# Patient Record
Sex: Female | Born: 1965 | Race: White | Hispanic: No | Marital: Married | Smoking: Never smoker
Health system: Southern US, Community
[De-identification: ages and names within clinical notes are randomized; demographics above are authoritative.]

## PROBLEM LIST (undated history)

## (undated) DIAGNOSIS — K589 Irritable bowel syndrome without diarrhea: Secondary | ICD-10-CM

## (undated) DIAGNOSIS — Z889 Allergy status to unspecified drugs, medicaments and biological substances status: Secondary | ICD-10-CM

## (undated) HISTORY — PX: KNEE SURGERY: SHX244

## (undated) HISTORY — DX: Irritable bowel syndrome, unspecified: K58.9

## (undated) HISTORY — PX: OTHER SURGICAL HISTORY: SHX169

## (undated) HISTORY — DX: Allergy status to unspecified drugs, medicaments and biological substances: Z88.9

---

## 2006-08-14 ENCOUNTER — Ambulatory Visit: Payer: Self-pay | Admitting: Cardiology

## 2006-08-18 ENCOUNTER — Ambulatory Visit: Payer: Self-pay | Admitting: Cardiology

## 2006-09-11 ENCOUNTER — Ambulatory Visit: Payer: Self-pay | Admitting: Cardiology

## 2015-08-10 ENCOUNTER — Encounter (HOSPITAL_COMMUNITY): Payer: Self-pay

## 2015-08-10 ENCOUNTER — Emergency Department (HOSPITAL_COMMUNITY): Payer: 59

## 2015-08-10 ENCOUNTER — Emergency Department (HOSPITAL_COMMUNITY)
Admission: EM | Admit: 2015-08-10 | Discharge: 2015-08-10 | Disposition: A | Discharge status: Home / Self Care | Payer: 59 | Attending: Emergency Medicine | Admitting: Emergency Medicine

## 2015-08-10 DIAGNOSIS — Y9301 Activity, walking, marching and hiking: Secondary | ICD-10-CM | POA: Diagnosis not present

## 2015-08-10 DIAGNOSIS — S8002XA Contusion of left knee, initial encounter: Secondary | ICD-10-CM | POA: Insufficient documentation

## 2015-08-10 DIAGNOSIS — S80212A Abrasion, left knee, initial encounter: Secondary | ICD-10-CM | POA: Diagnosis not present

## 2015-08-10 DIAGNOSIS — S6991XA Unspecified injury of right wrist, hand and finger(s), initial encounter: Secondary | ICD-10-CM | POA: Diagnosis not present

## 2015-08-10 DIAGNOSIS — S01511A Laceration without foreign body of lip, initial encounter: Secondary | ICD-10-CM | POA: Insufficient documentation

## 2015-08-10 DIAGNOSIS — S0992XA Unspecified injury of nose, initial encounter: Secondary | ICD-10-CM | POA: Diagnosis not present

## 2015-08-10 DIAGNOSIS — S0993XA Unspecified injury of face, initial encounter: Secondary | ICD-10-CM | POA: Diagnosis present

## 2015-08-10 DIAGNOSIS — T148 Other injury of unspecified body region: Secondary | ICD-10-CM | POA: Diagnosis not present

## 2015-08-10 DIAGNOSIS — Y9289 Other specified places as the place of occurrence of the external cause: Secondary | ICD-10-CM | POA: Insufficient documentation

## 2015-08-10 DIAGNOSIS — W01198A Fall on same level from slipping, tripping and stumbling with subsequent striking against other object, initial encounter: Secondary | ICD-10-CM | POA: Insufficient documentation

## 2015-08-10 DIAGNOSIS — Z23 Encounter for immunization: Secondary | ICD-10-CM | POA: Insufficient documentation

## 2015-08-10 DIAGNOSIS — W19XXXA Unspecified fall, initial encounter: Secondary | ICD-10-CM

## 2015-08-10 DIAGNOSIS — T07XXXA Unspecified multiple injuries, initial encounter: Secondary | ICD-10-CM

## 2015-08-10 DIAGNOSIS — Y998 Other external cause status: Secondary | ICD-10-CM | POA: Diagnosis not present

## 2015-08-10 MED ORDER — TETANUS-DIPHTH-ACELL PERTUSSIS 5-2.5-18.5 LF-MCG/0.5 IM SUSP
INTRAMUSCULAR | Status: AC
  Administered 2015-08-10: 0.5 mL via INTRAMUSCULAR
  Filled 2015-08-10: qty 0.5

## 2015-08-10 MED ORDER — HYDROCODONE-ACETAMINOPHEN 5-325 MG PO TABS
2.0000 | ORAL_TABLET | Freq: Once | ORAL | Status: AC
  Administered 2015-08-10: 2 via ORAL
  Filled 2015-08-10: qty 2

## 2015-08-10 MED ORDER — PROMETHAZINE HCL 12.5 MG PO TABS
12.5000 mg | ORAL_TABLET | Freq: Once | ORAL | Status: AC
  Administered 2015-08-10: 12.5 mg via ORAL
  Filled 2015-08-10: qty 1

## 2015-08-10 MED ORDER — BACITRACIN ZINC 500 UNIT/GM EX OINT
TOPICAL_OINTMENT | CUTANEOUS | Status: AC
  Filled 2015-08-10: qty 0.9

## 2015-08-10 MED ORDER — BACITRACIN-NEOMYCIN-POLYMYXIN 400-5-5000 EX OINT: TOPICAL_OINTMENT | Freq: Once | CUTANEOUS | Status: DC

## 2015-08-10 MED ORDER — HYDROCODONE-ACETAMINOPHEN 5-325 MG PO TABS: 1.0000 | ORAL_TABLET | ORAL | Status: DC | PRN

## 2015-08-10 NOTE — ED Provider Notes (Signed)
CSN: [id number]     Arrival date & time 08/10/15  1953 History   First MD Initiated Contact with Patient 08/10/15 2005     Chief Complaint  Patient presents with  . Fall     (Consider location/radiation/quality/duration/timing/severity/associated sxs/prior Treatment) Patient is a 50 y.o. female presenting with fall. The history is provided by the patient.  Fall This is a new problem. The current episode started today. The problem occurs intermittently. The problem has been unchanged. Associated symptoms include arthralgias. Associated symptoms comments: LEFT KNEE PAIN. Right pinky pain, nose bleed. Nothing aggravates the symptoms. She has tried nothing for the symptoms. The treatment provided no relief.    History reviewed. No pertinent past medical history. Past Surgical History  Procedure Laterality Date  . Knee surgery Left    No family history on file. Social History  Substance Use Topics  . Smoking status: Never Smoker   . Smokeless tobacco: None  . Alcohol Use: No   OB History    No data available     Review of Systems  Musculoskeletal: Positive for arthralgias.  All other systems reviewed and are negative.     Allergies  Review of patient's allergies indicates no known allergies.  Home Medications   Prior to Admission medications   Not on File   BP 148/73 mmHg  Pulse 101  Temp(Src) 98.4 F (36.9 C)  Resp 20  Ht 5\' 1"  (1.549 m)  Wt 52.164 kg  BMI 21.74 kg/m2  SpO2 100%  LMP 07/06/2015 (Exact Date) Physical Exam  Constitutional: She is oriented to person, place, and time. She appears well-developed and well-nourished.  Non-toxic appearance.  HENT:  Head: Normocephalic.  Right Ear: Tympanic membrane and external ear normal.  Left Ear: Tympanic membrane and external ear normal.  There is minimal orbital tenderness. There is soreness and sensitivity of the nose. No obvious dislocation. There no chipped teeth. There is a laceration that is approximately 1  cm of the mucosa of the lower lip. This is not a through and through laceration. There is no trauma to the tongue. The airway is patent.  Eyes: EOM and lids are normal. Pupils are equal, round, and reactive to light.  Neck: Normal range of motion. Neck supple. Carotid bruit is not present.  Cardiovascular: Normal rate, regular rhythm, normal heart sounds, intact distal pulses and normal pulses.   Pulmonary/Chest: Breath sounds normal. No respiratory distress.  No chest wall tenderness.  Abdominal: Soft. Bowel sounds are normal. There is no tenderness. There is no guarding.  Musculoskeletal: Normal range of motion.  There is mild swelling at the MP joint area of the right fifth finger. There is full range of motion of the right and left wrist, elbow, and shoulders. There is no pain with movement of the pelvis. There is good range of motion of the right and left knee. There is a bruise and an abrasion of the left knee. The anterior tibial tuberosity is intact. There is no quadricep area deformity. There is full range of motion of the ankles. The Achilles tendon is intact.  Lymphadenopathy:       Head (right side): No submandibular adenopathy present.       Head (left side): No submandibular adenopathy present.    She has no cervical adenopathy.  Neurological: She is alert and oriented to person, place, and time. She has normal strength. No cranial nerve deficit or sensory deficit.  Skin: Skin is warm and dry.  Psychiatric: She has a  normal mood and affect. Her speech is normal.  Nursing note and vitals reviewed.   ED Course  Procedures (including critical care time) Labs Review Labs Reviewed - No data to display  Imaging Review No results found. I have personally reviewed and evaluated these images and lab results as part of my medical decision-making.   EKG Interpretation None      MDM  Vital signs reviewed. Xray of the nasal bones, right 5th finger, and left knee are negative for  fx or dislocation. Tetanus status updated. Findings discussed with patient. Rx for norco to be use for pain not improved by ibuprofen or tylenol. Pt will return if any changes or problem. Questions answered.   Final diagnoses:  Fall, initial encounter  Laceration of lip, initial encounter  Abrasions of multiple sites  Multiple contusions    **I have reviewed nursing notes, vital signs, and all appropriate lab and imaging results for this patient.Lily Kocher, PA-C 08/10/15 2122  Fredia Sorrow, MD 08/11/15 [phone]

## 2015-08-10 NOTE — Discharge Instructions (Signed)
Your x-rays are negative for fracture or dislocation. Please update your records that your tetanus status was updated. Use Tylenol or ibuprofen for mild soreness. Use Norco for more severe soreness. Norco may cause drowsiness, please use with caution. Ice pack to your lip and knee will be helpful. Mouth Laceration A mouth laceration is a deep cut inside your mouth. The cut may go into your lip or go all of the way through your mouth and cheek. The cut may involve your tongue, the insides of your check, or the upper surface of your mouth (palate). Mouth lacerations may bleed a lot and may need to be treated with stitches (sutures). HOME CARE  Take medicines only as told by your doctor.  If you were prescribed an antibiotic medicine, finish all of it even if you start to feel better.  Eat as told by your doctor. You may only be able to eat drink liquids or eat soft foods for a few days.  Rinse your mouth with a warm, salt-water rinse 4-6 times per day or as told by your doctor. You can make a salt-water rinse by mixing one tsp of salt into two cups of warm water.  Do not poke the sutures with your tongue. Doing that can loosen them.  Check your wound every day for signs of infection. It is normal to have a white or gray patch over your wound while it heals. Watch for:  Redness.  Puffiness (swelling).  Blood or pus.  Keep your mouth and teeth clean (oral hygiene) like you normally do, if possible. Gently brush your teeth with a soft, nylon-bristled toothbrush 2 times per day.  Keep all follow-up visits as told by your doctor. This is important. GET HELP IF:  You got a tetanus shot and you have swelling, really bad pain, redness, or bleeding at the injection site.  You have a fever.  Medicine does not help your pain.  You have redness, swelling, or pain at your wound that is getting worse.  You have fresh bleeding or pus coming from your wound.  The edges of your wound break  open.  Your neck or throat is puffy or tender. GET HELP RIGHT AWAY IF:  You have swelling in your face or the area under your jaw.  You have trouble breathing or swallowing.   This information is not intended to replace advice given to you by your health care provider. Make sure you discuss any questions you have with your health care provider.   Document Released: 11/12/2007 Document Revised: 10/10/2014 Document Reviewed: 05/17/2014 Elsevier Interactive Patient Education [employer].

## 2015-08-10 NOTE — ED Notes (Signed)
My daughter and I were walking and we decided to run and tripped over some fencing and fell. Busted my mouth, my left knee hurts, and my right pinky hurts.

## 2015-08-10 NOTE — ED Notes (Signed)
Pt verbalized understanding of no driving and to use caution within 4 hours of taking pain meds due to meds cause drowsiness 

## 2015-10-22 ENCOUNTER — Encounter (INDEPENDENT_AMBULATORY_CARE_PROVIDER_SITE_OTHER): Payer: Self-pay | Admitting: *Deleted

## 2015-12-26 ENCOUNTER — Encounter (INDEPENDENT_AMBULATORY_CARE_PROVIDER_SITE_OTHER): Payer: Self-pay | Admitting: Internal Medicine

## 2016-01-14 ENCOUNTER — Ambulatory Visit (INDEPENDENT_AMBULATORY_CARE_PROVIDER_SITE_OTHER): Payer: 59 | Admitting: Internal Medicine

## 2016-01-14 ENCOUNTER — Encounter (INDEPENDENT_AMBULATORY_CARE_PROVIDER_SITE_OTHER): Payer: Self-pay | Admitting: Internal Medicine

## 2016-02-25 ENCOUNTER — Encounter (INDEPENDENT_AMBULATORY_CARE_PROVIDER_SITE_OTHER): Payer: Self-pay | Admitting: Internal Medicine

## 2016-03-10 ENCOUNTER — Telehealth (INDEPENDENT_AMBULATORY_CARE_PROVIDER_SITE_OTHER): Payer: Self-pay | Admitting: *Deleted

## 2016-03-10 ENCOUNTER — Ambulatory Visit (INDEPENDENT_AMBULATORY_CARE_PROVIDER_SITE_OTHER): Payer: 59 | Admitting: Internal Medicine

## 2016-03-10 ENCOUNTER — Encounter (INDEPENDENT_AMBULATORY_CARE_PROVIDER_SITE_OTHER): Payer: Self-pay | Admitting: *Deleted

## 2016-03-10 ENCOUNTER — Encounter (INDEPENDENT_AMBULATORY_CARE_PROVIDER_SITE_OTHER): Payer: Self-pay | Admitting: Internal Medicine

## 2016-03-10 ENCOUNTER — Other Ambulatory Visit (INDEPENDENT_AMBULATORY_CARE_PROVIDER_SITE_OTHER): Payer: Self-pay | Admitting: Internal Medicine

## 2016-03-10 DIAGNOSIS — Z889 Allergy status to unspecified drugs, medicaments and biological substances status: Secondary | ICD-10-CM | POA: Insufficient documentation

## 2016-03-10 DIAGNOSIS — K588 Other irritable bowel syndrome: Secondary | ICD-10-CM

## 2016-03-10 DIAGNOSIS — K589 Irritable bowel syndrome without diarrhea: Secondary | ICD-10-CM | POA: Insufficient documentation

## 2016-03-10 MED ORDER — PEG 3350-KCL-NA BICARB-NACL 420 G PO SOLR: 4000.0000 mL | Freq: Once | ORAL | 0 refills | Status: AC

## 2016-03-10 NOTE — Patient Instructions (Signed)
The risks and benefits such as perforation, bleeding, and infection were reviewed with the patient and is agreeable. 

## 2016-03-10 NOTE — Progress Notes (Signed)
   Subjective:    Patient ID: Tina Yoder, female    DOB: September 06, 1965, 50 y.o.   MRN: [id number]  HPI Referred by Dr.Buist. screening colonoscopy/ hx  of IBS/GERD.   Father has UC and daughter has UC. She tells me she is doing good for the most part. She occasionally has diarrhea.  She does have some gas and bloating.  She says her stools are better. Her stools are more formed. She has a BM usually every 2 days or sometimes one a day. No melena or BRRB.  Her appetite is good. No weight loss. Rarely has acid reflux.  NO NSAIDs   Review of Systems No past medical history on file.  Past Surgical History:  Procedure Laterality Date  . KNEE SURGERY Left     No Known Allergies  Current Outpatient Prescriptions on File Prior to Visit  Medication Sig Dispense Refill  . cetirizine (ZYRTEC) 10 MG tablet Take 10 mg by mouth every morning.    Marland Kitchen JOLIVETTE 0.35 MG tablet Take 1 tablet by mouth daily.  3   No current facility-administered medications on file prior to visit.        Objective:   Physical Exam Blood pressure (!) 90/50, pulse 60, temperature 98.2 F (36.8 C), height 5\' 1"  (1.549 m), weight 121 lb 14.4 oz (55.3 kg).  Alert and oriented. Skin warm and dry. Oral mucosa is moist.   . Sclera anicteric, conjunctivae is pink. Thyroid not enlarged. No cervical lymphadenopathy. Lungs clear. Heart regular rate and rhythm.  Abdomen is soft. Bowel sounds are positive. No hepatomegaly. No abdominal masses felt. No tenderness.  No edema to lower extremities.         Assessment & Plan:  Hx of IBS. Screening colonoscopy. The risks and benefits such as perforation, bleeding, and infection were reviewed with the patient and is agreeable.

## 2016-03-10 NOTE — Telephone Encounter (Signed)
Patient needs trilyte 

## 2016-04-01 ENCOUNTER — Telehealth (INDEPENDENT_AMBULATORY_CARE_PROVIDER_SITE_OTHER): Payer: Self-pay | Admitting: Internal Medicine

## 2016-04-01 NOTE — Telephone Encounter (Signed)
Patient called, stated that she is scheduled for a baseline colonoscopy, but wanted to let you know that she has had a change in symptoms from this past Friday - today.  [phone]

## 2016-04-01 NOTE — Telephone Encounter (Signed)
No answer at home #

## 2016-04-02 ENCOUNTER — Telehealth (INDEPENDENT_AMBULATORY_CARE_PROVIDER_SITE_OTHER): Payer: Self-pay | Admitting: Internal Medicine

## 2016-04-02 NOTE — Telephone Encounter (Signed)
No answer at home #

## 2016-04-02 NOTE — Telephone Encounter (Signed)
Patient thinks she has something acute going on.  Ate a sandwich around 8pm last night and within an hour her bowels were straight liquid.  Was up at 4:30am with the same issue.  Would like to speak to you to see what you suggest.  [phone]

## 2016-05-14 ENCOUNTER — Other Ambulatory Visit (INDEPENDENT_AMBULATORY_CARE_PROVIDER_SITE_OTHER): Payer: Self-pay | Admitting: Internal Medicine

## 2016-05-14 DIAGNOSIS — K589 Irritable bowel syndrome without diarrhea: Secondary | ICD-10-CM

## 2016-05-15 ENCOUNTER — Ambulatory Visit (HOSPITAL_COMMUNITY)
Admission: RE | Admit: 2016-05-15 | Discharge: 2016-05-15 | Disposition: A | Discharge status: Home / Self Care | Payer: 59 | Source: Ambulatory Visit | Attending: Internal Medicine | Admitting: Internal Medicine

## 2016-05-15 ENCOUNTER — Encounter (HOSPITAL_COMMUNITY): Payer: Self-pay | Admitting: *Deleted

## 2016-05-15 ENCOUNTER — Encounter (HOSPITAL_COMMUNITY)
Admission: RE | Disposition: A | Discharge status: Home / Self Care | Payer: Self-pay | Source: Ambulatory Visit | Attending: Internal Medicine

## 2016-05-15 DIAGNOSIS — Z1211 Encounter for screening for malignant neoplasm of colon: Secondary | ICD-10-CM | POA: Diagnosis not present

## 2016-05-15 DIAGNOSIS — K588 Other irritable bowel syndrome: Secondary | ICD-10-CM | POA: Insufficient documentation

## 2016-05-15 DIAGNOSIS — K580 Irritable bowel syndrome with diarrhea: Secondary | ICD-10-CM | POA: Insufficient documentation

## 2016-05-15 DIAGNOSIS — K589 Irritable bowel syndrome without diarrhea: Secondary | ICD-10-CM

## 2016-05-15 HISTORY — PX: COLONOSCOPY: SHX5424

## 2016-05-15 SURGERY — COLONOSCOPY
Anesthesia: Moderate Sedation

## 2016-05-15 MED ORDER — MEPERIDINE HCL 50 MG/ML IJ SOLN
INTRAMUSCULAR | Status: DC | PRN
  Administered 2016-05-15 (×2): 25 mg via INTRAVENOUS

## 2016-05-15 MED ORDER — SODIUM CHLORIDE 0.9 % IV SOLN
INTRAVENOUS | Status: DC
  Administered 2016-05-15: 14:00:00 via INTRAVENOUS

## 2016-05-15 MED ORDER — MIDAZOLAM HCL 5 MG/5ML IJ SOLN
INTRAMUSCULAR | Status: AC
  Filled 2016-05-15: qty 10

## 2016-05-15 MED ORDER — MIDAZOLAM HCL 5 MG/5ML IJ SOLN
INTRAMUSCULAR | Status: DC | PRN
  Administered 2016-05-15 (×3): 2 mg via INTRAVENOUS

## 2016-05-15 MED ORDER — MEPERIDINE HCL 50 MG/ML IJ SOLN
INTRAMUSCULAR | Status: AC
  Filled 2016-05-15: qty 1

## 2016-05-15 MED ORDER — STERILE WATER FOR IRRIGATION IR SOLN
Status: DC | PRN
  Administered 2016-05-15: 2.5 mL

## 2016-05-15 NOTE — Op Note (Signed)
[facility] Patient Name: Tina Yoder Procedure Date: 05/15/2016 2:21 PM MRN: [id number] Date of Birth: 1966-01-11 Attending MD: Hildred Laser , MD CSN: [id number] Age: 50 Admit Type: Outpatient Procedure:                Colonoscopy Indications:              Screening for colorectal malignant neoplasm Providers:                Hildred Laser, MD, Lurline Del, RN, Purcell Nails. Mauldin,                            Technician Referring MD:             Gari Crown, MD and Manon Hilding, MD Medicines:                Meperidine 50 mg IV, Midazolam 6 mg IV Complications:            No immediate complications. Estimated Blood Loss:     Estimated blood loss: none. Procedure:                Pre-Anesthesia Assessment:                           - Prior to the procedure, a History and Physical                            was performed, and patient medications and                            allergies were reviewed. The patient's tolerance of                            previous anesthesia was also reviewed. The risks                            and benefits of the procedure and the sedation                            options and risks were discussed with the patient.                            All questions were answered, and informed consent                            was obtained. Prior Anticoagulants: The patient                            last took ibuprofen 7 days prior to the procedure.                            ASA Grade Assessment: I - A normal, healthy                            patient. After reviewing the risks and benefits,  the patient was deemed in satisfactory condition to                            undergo the procedure.                           After obtaining informed consent, the colonoscope                            was passed under direct vision. Throughout the                            procedure, the patient's blood pressure, pulse, and                             oxygen saturations were monitored continuously. The                            [id number]) scope was introduced through                            the anus and advanced to the the terminal ileum,                            with identification of the appendiceal orifice and                            IC valve. The colonoscopy was performed without                            difficulty. The patient tolerated the procedure                            well. The quality of the bowel preparation was                            adequate to identify polyps. The terminal ileum,                            ileocecal valve, appendiceal orifice, and rectum                            were photographed. Scope In: 2:35:57 PM Scope Out: 2:58:18 PM Scope Withdrawal Time: 0 hours 11 minutes 14 seconds  Total Procedure Duration: 0 hours 22 minutes 21 seconds  Findings:      The terminal ileum appeared normal.      The colon (entire examined portion) appeared normal.      The retroflexed view of the distal rectum and anal verge was normal and       showed no anal or rectal abnormalities. Impression:               - The examined portion of the ileum was normal.                           -  The entire examined colon is normal.                           - No specimens collected. Moderate Sedation:      Moderate (conscious) sedation was administered by the endoscopy nurse       and supervised by the endoscopist. The following parameters were       monitored: oxygen saturation, heart rate, blood pressure, CO2       capnography and response to care. Total physician intraservice time was       29 minutes. Recommendation:           - Patient has a contact number available for                            emergencies. The signs and symptoms of potential                            delayed complications were discussed with the                            patient. Return to normal activities tomorrow.                             Written discharge instructions were provided to the                            patient.                           - Resume previous diet today.                           - Continue present medications.                           - Repeat colonoscopy in 10 years for screening                            purposes. Procedure Code(s):        --- Professional ---                           [id number], Colonoscopy, flexible; diagnostic, including                            collection of specimen(s) by brushing or washing,                            when performed (separate procedure)                           99152, Moderate sedation services provided by the                            same physician or other qualified health care  professional performing the diagnostic or                            therapeutic service that the sedation supports,                            requiring the presence of an independent trained                            observer to assist in the monitoring of the                            patient's level of consciousness and physiological                            status; initial 15 minutes of intraservice time,                            patient age 89 years or older                           [phone], Moderate sedation services; each additional                            15 minutes intraservice time Diagnosis Code(s):        --- Professional ---                           Z12.11, Encounter for screening for malignant                            neoplasm of colon CPT copyright 2016 American Medical Association. All rights reserved. The codes documented in this report are preliminary and upon coder review may  be revised to meet current compliance requirements. Hildred Laser, MD Hildred Laser, MD 05/15/2016 3:44:06 PM This report has been signed electronically. Number of Addenda: 0

## 2016-05-15 NOTE — H&P (Signed)
Tina Yoder is an 50 y.o. female.   Chief Complaint: Patient is here for colonoscopy. HPI: Patient is 50 year old Caucasian female who is here for screening colonoscopy. She denies abdominal pain or rectal bleeding. She has history of IBS with intermittent diarrhea and urgency with headache and she's done well. Family history is negative for CRC.  Past Medical History:  Diagnosis Date  . History of seasonal allergies   . IBS (irritable bowel syndrome)     Past Surgical History:  Procedure Laterality Date  . Calcium deposit from left knee in 1981    . KNEE SURGERY Left     Family History  Problem Relation Age of Onset  . Colon cancer Neg Hx    Social History:  reports that she has never smoked. She has never used smokeless tobacco. She reports that she drinks alcohol. She reports that she does not use drugs.  Allergies: No Known Allergies  Medications Prior to Admission  Medication Sig Dispense Refill  . cetirizine (ZYRTEC) 10 MG tablet Take 10 mg by mouth every morning.    . diphenhydrAMINE (BENADRYL) 25 MG tablet Take 25 mg by mouth every 8 (eight) hours as needed (allergies/irritable bowel syndrome).     Marland Kitchen ibuprofen (ADVIL,MOTRIN) 200 MG tablet Take 600 mg by mouth every 8 (eight) hours as needed (for pain).    . JOLIVETTE 0.35 MG tablet Take 1 tablet by mouth at bedtime.   3  . Multiple Vitamin (MULTIVITAMIN) tablet Take 1 tablet by mouth daily.     . Naphazoline-Pheniramine (OPCON-A) 0.027-0.315 % SOLN Place 1 drop into both eyes 3 (three) times daily as needed (for dry/itchy eyes).    . ranitidine (ZANTAC) 150 MG tablet Take 150 mg by mouth 2 (two) times daily as needed (for acid reflux).       No results found for this or any previous visit (from the past 48 hour(s)). No results found.  ROS  Blood pressure 109/71, pulse 77, temperature 97.6 F (36.4 C), resp. rate 18, height 5\' 1"  (1.549 m), weight 120 lb (54.4 kg), last menstrual period 05/10/2016, SpO2 100  %. Physical Exam  Constitutional: She appears well-developed and well-nourished.  HENT:  Mouth/Throat: Oropharynx is clear and moist.  Eyes: Conjunctivae are normal. No scleral icterus.  Neck: No thyromegaly present.  Cardiovascular: Normal rate, regular rhythm and normal heart sounds.   No murmur heard. Respiratory: Effort normal and breath sounds normal.  GI: Soft. She exhibits no distension and no mass. There is no tenderness.  Musculoskeletal: She exhibits no edema.  Neurological: She is alert.  Skin: Skin is warm and dry.     Assessment/Plan Average risk screening colonoscopy.  Hildred Laser, MD 05/15/2016, 2:23 PM

## 2016-05-15 NOTE — Discharge Instructions (Signed)
Resume usual medications and diet. °No driving for 24 hours. °Next screening exam in 10 years. ° ° °Colonoscopy, Adult, Care After °This sheet gives you information about how to care for yourself after your procedure. Your health care provider may also give you more specific instructions. If you have problems or questions, contact your health care provider. °What can I expect after the procedure? °After the procedure, it is common to have: °· A small amount of blood in your stool for 24 hours after the procedure. °· Some gas. °· Mild abdominal cramping or bloating. °Follow these instructions at home: °General instructions °· For the first 24 hours after the procedure: °¨ Do not drive or use machinery. °¨ Do not sign important documents. °¨ Do not drink alcohol. °¨ Do your regular daily activities at a slower pace than normal. °¨ Eat soft, easy-to-digest foods. °¨ Rest often. °· Take over-the-counter or prescription medicines only as told by your health care provider. °· It is up to you to get the results of your procedure. Ask your health care provider, or the department performing the procedure, when your results will be ready. °Relieving cramping and bloating °· Try walking around when you have cramps or feel bloated. °· Apply heat to your abdomen as told by your health care provider. Use a heat source that your health care provider recommends, such as a moist heat pack or a heating pad. °¨ Place a towel between your skin and the heat source. °¨ Leave the heat on for 20-30 minutes. °¨ Remove the heat if your skin turns bright red. This is especially important if you are unable to feel pain, heat, or cold. You may have a greater risk of getting burned. °Eating and drinking °· Drink enough fluid to keep your urine clear or pale yellow. °· Resume your normal diet as instructed by your health care provider. Avoid heavy or fried foods that are hard to digest. °· Avoid drinking alcohol for as long as instructed by your  health care provider. °Contact a health care provider if: °· You have blood in your stool 2-3 days after the procedure. °Get help right away if: °· You have more than a small spotting of blood in your stool. °· You pass large blood clots in your stool. °· Your abdomen is swollen. °· You have nausea or vomiting. °· You have a fever. °· You have increasing abdominal pain that is not relieved with medicine. °This information is not intended to replace advice given to you by your health care provider. Make sure you discuss any questions you have with your health care provider. °Document Released: 01/08/2004 Document Revised: 02/18/2016 Document Reviewed: 08/07/2015 °Elsevier Interactive Patient Education © 2017 Elsevier Inc[employer] ° °

## 2016-05-20 ENCOUNTER — Encounter (HOSPITAL_COMMUNITY): Payer: Self-pay | Admitting: Internal Medicine

## 2016-08-27 DIAGNOSIS — M21612 Bunion of left foot: Secondary | ICD-10-CM | POA: Diagnosis not present

## 2016-08-27 DIAGNOSIS — Z0000 Encounter for general adult medical examination without abnormal findings: Secondary | ICD-10-CM | POA: Diagnosis not present

## 2016-08-27 DIAGNOSIS — M79675 Pain in left toe(s): Secondary | ICD-10-CM | POA: Diagnosis not present

## 2016-08-27 DIAGNOSIS — M25532 Pain in left wrist: Secondary | ICD-10-CM | POA: Diagnosis not present

## 2016-09-02 DIAGNOSIS — Z0000 Encounter for general adult medical examination without abnormal findings: Secondary | ICD-10-CM | POA: Diagnosis not present

## 2016-11-19 ENCOUNTER — Telehealth (INDEPENDENT_AMBULATORY_CARE_PROVIDER_SITE_OTHER): Payer: Self-pay | Admitting: Internal Medicine

## 2016-11-19 DIAGNOSIS — K588 Other irritable bowel syndrome: Secondary | ICD-10-CM

## 2016-11-19 MED ORDER — DICYCLOMINE HCL 10 MG PO CAPS: 10.0000 mg | ORAL_CAPSULE | Freq: Two times a day (BID) | ORAL | 0 refills | Status: DC

## 2016-11-19 NOTE — Telephone Encounter (Signed)
Patient called, stated that she has had an increased symptoms with IBS.  She was doing really well and now she's not.  She wants to know if you you can call in Dicyclomine or if she needs to come in for a visit.  [phone]

## 2016-11-19 NOTE — Telephone Encounter (Signed)
Please let patient know I called in low dose Dicyclomine.

## 2016-11-20 ENCOUNTER — Telehealth (INDEPENDENT_AMBULATORY_CARE_PROVIDER_SITE_OTHER): Payer: Self-pay | Admitting: Internal Medicine

## 2016-11-20 DIAGNOSIS — R110 Nausea: Secondary | ICD-10-CM

## 2016-11-20 MED ORDER — ONDANSETRON 8 MG PO TBDP: 8.0000 mg | ORAL_TABLET | Freq: Three times a day (TID) | ORAL | 0 refills | Status: AC | PRN

## 2016-11-20 NOTE — Telephone Encounter (Signed)
Husband presented to Office and stated now his wife was nauseated. Requested Rx for zofran and phenergan. I sent an Rx in for Zofran 8mg .

## 2016-11-20 NOTE — Telephone Encounter (Signed)
Rx has been sent in. 

## 2016-11-20 NOTE — Telephone Encounter (Signed)
err

## 2016-11-20 NOTE — Telephone Encounter (Signed)
Patient's spouse Nicki Reaper presented to the office and stated that the medication called in yesterday did not stop the nausea and she is now throwing up.  He would like you to call in Phenergan and Zofran and he then can determine which will be cheaper on his end.  [phone]

## 2017-03-02 DIAGNOSIS — Z01419 Encounter for gynecological examination (general) (routine) without abnormal findings: Secondary | ICD-10-CM | POA: Diagnosis not present

## 2017-03-17 IMAGING — DX DG KNEE COMPLETE 4+V*L*
4 series · 4 of 4 positions shown · non-contrast
Comparison: None.

CLINICAL DATA: Initial encounter for Pt was running and tripped
today, pt scraped anterior left knee, pt fell face first, bridge of
nose swollen, right little finger painful

EXAM:
LEFT KNEE - COMPLETE 4+ VIEW

[knee ap]
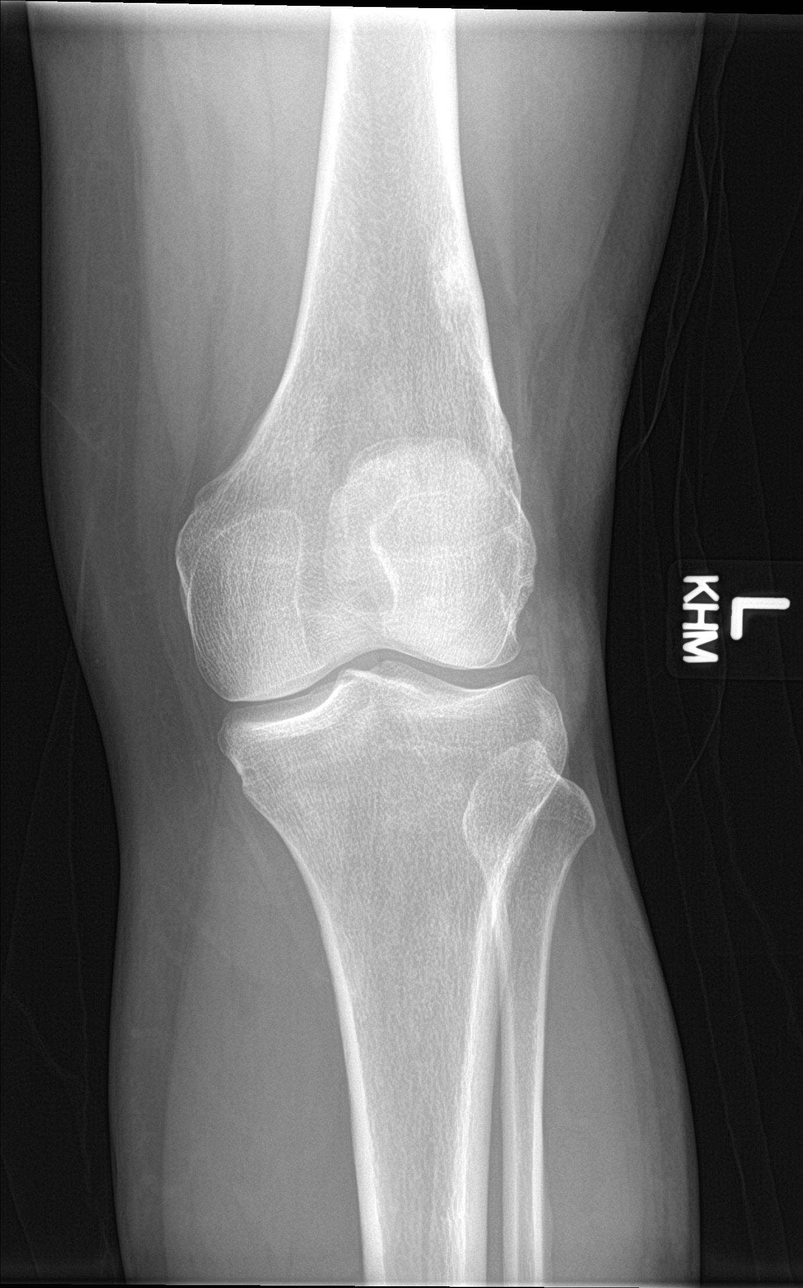

[tunnel]
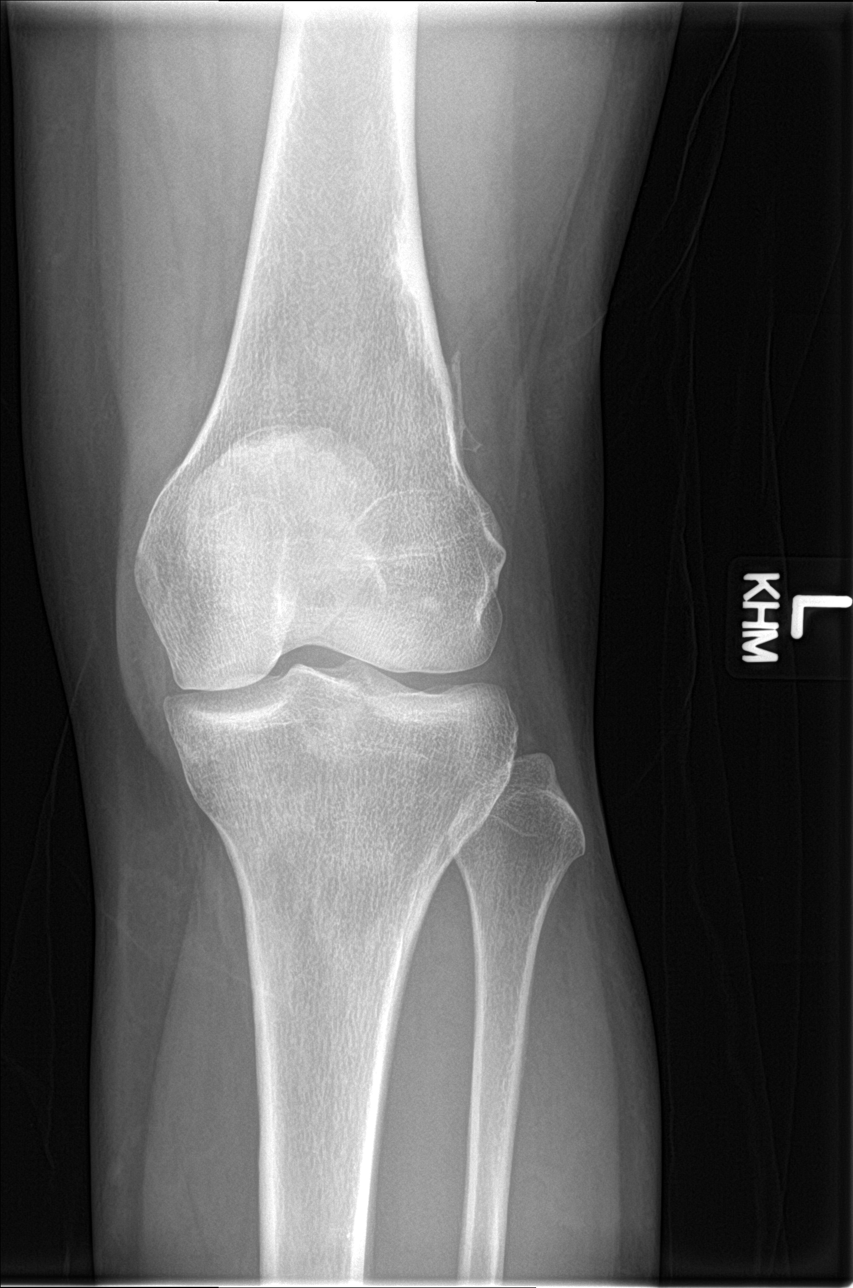

[knee lat]
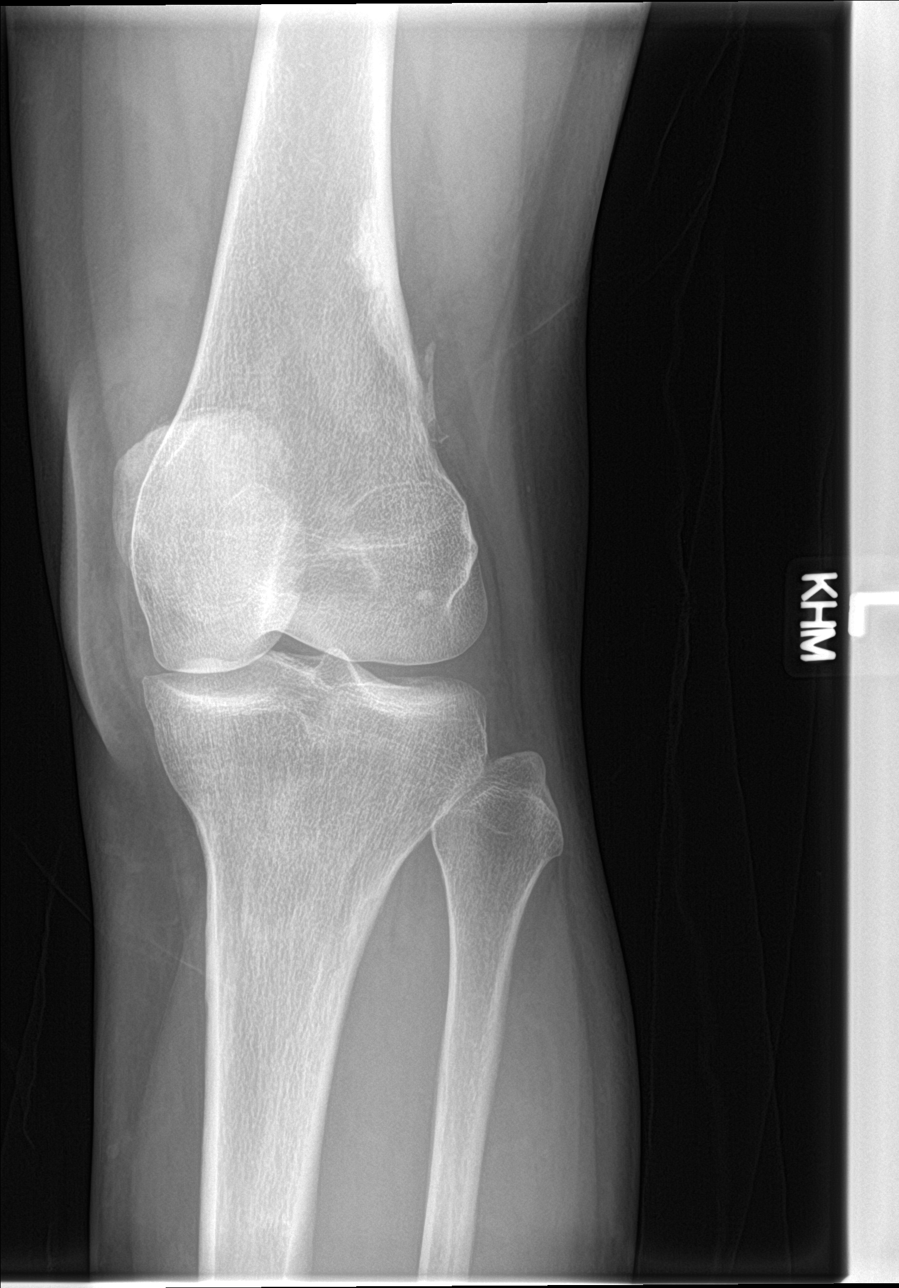

[knee sunrise]
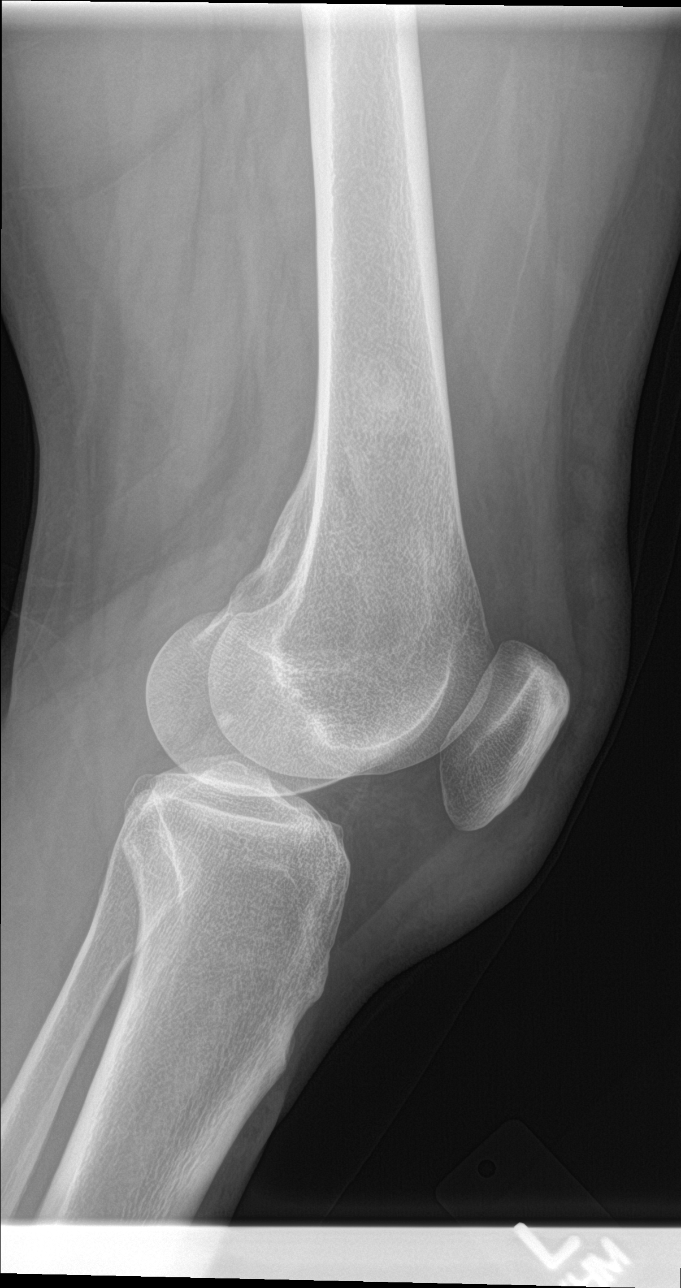

[4 of 4 positions shown; findings below may reference images not displayed]

FINDINGS: Mild prepatellar soft tissue swelling, without acute fracture
dislocation. No joint effusion. Question remote trauma about the
lateral collateral ligament origin.
IMPRESSION: Prepatellar soft tissue swelling, without acute osseous abnormality.

## 2017-03-17 IMAGING — DX DG FINGER LITTLE 2+V*R*
3 series · 3 of 3 positions shown · non-contrast
Comparison: None.

CLINICAL DATA: Tripped while running today, injury to right fifth
finger

EXAM:
RIGHT LITTLE FINGER 2+V

[finger ap]
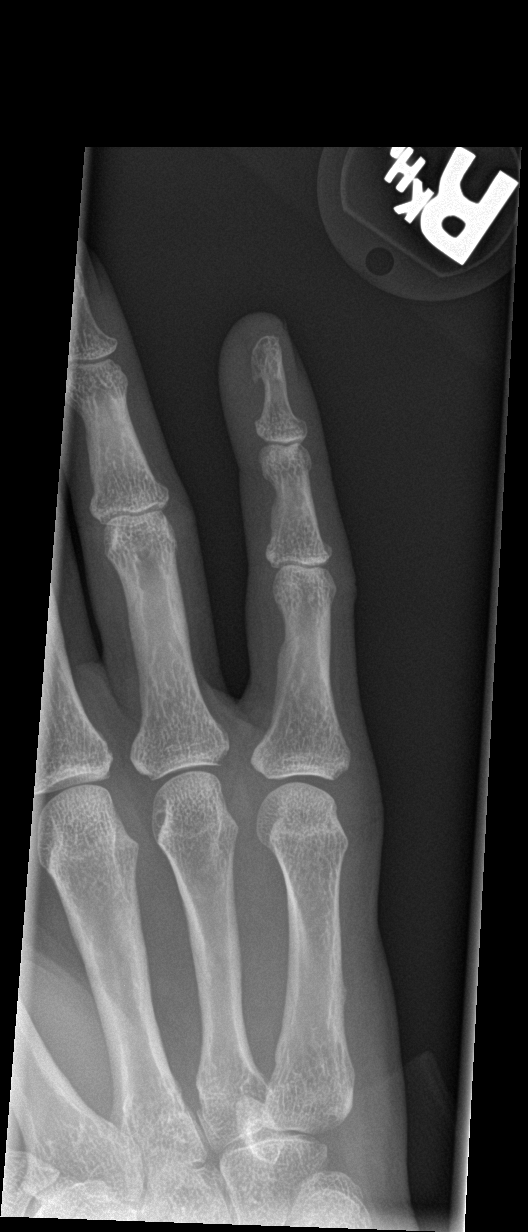

[finger obl]
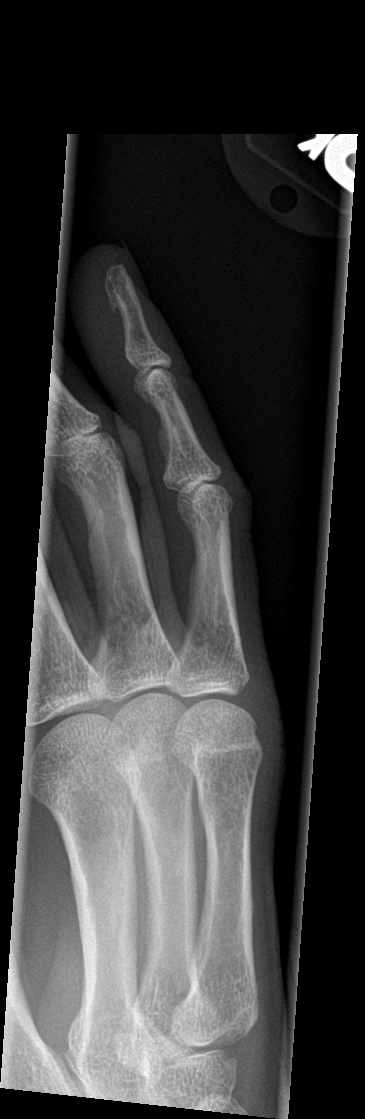

[finger lat]
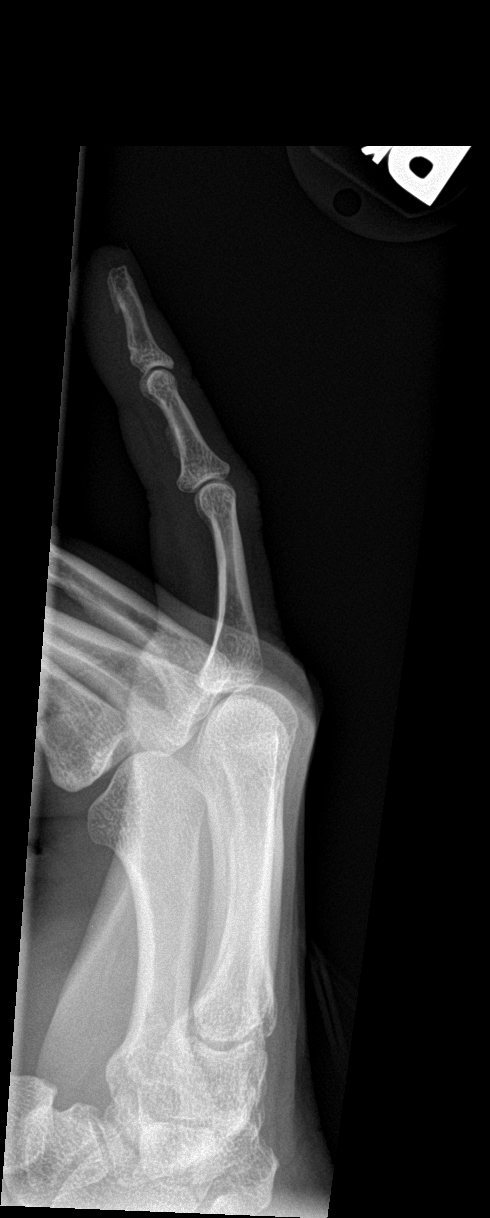

[3 of 3 positions shown; findings below may reference images not displayed]

FINDINGS: There is no evidence of fracture or dislocation. There is no
evidence of arthropathy or other focal bone abnormality. Soft
tissues are unremarkable.
IMPRESSION: Negative.

## 2017-10-12 DIAGNOSIS — Z1231 Encounter for screening mammogram for malignant neoplasm of breast: Secondary | ICD-10-CM | POA: Diagnosis not present

## 2017-10-22 DIAGNOSIS — D239 Other benign neoplasm of skin, unspecified: Secondary | ICD-10-CM | POA: Diagnosis not present

## 2017-10-22 DIAGNOSIS — L821 Other seborrheic keratosis: Secondary | ICD-10-CM | POA: Diagnosis not present

## 2017-10-22 DIAGNOSIS — L814 Other melanin hyperpigmentation: Secondary | ICD-10-CM | POA: Diagnosis not present

## 2017-11-12 DIAGNOSIS — D485 Neoplasm of uncertain behavior of skin: Secondary | ICD-10-CM | POA: Diagnosis not present

## 2017-11-12 DIAGNOSIS — D225 Melanocytic nevi of trunk: Secondary | ICD-10-CM | POA: Diagnosis not present

## 2017-11-12 DIAGNOSIS — L570 Actinic keratosis: Secondary | ICD-10-CM | POA: Diagnosis not present

## 2017-11-12 DIAGNOSIS — L821 Other seborrheic keratosis: Secondary | ICD-10-CM | POA: Diagnosis not present

## 2017-11-12 DIAGNOSIS — L905 Scar conditions and fibrosis of skin: Secondary | ICD-10-CM | POA: Diagnosis not present

## 2018-03-10 DIAGNOSIS — Z01419 Encounter for gynecological examination (general) (routine) without abnormal findings: Secondary | ICD-10-CM | POA: Diagnosis not present

## 2018-04-20 ENCOUNTER — Ambulatory Visit (INDEPENDENT_AMBULATORY_CARE_PROVIDER_SITE_OTHER): Payer: 59 | Admitting: Internal Medicine

## 2018-04-20 ENCOUNTER — Encounter (INDEPENDENT_AMBULATORY_CARE_PROVIDER_SITE_OTHER): Payer: Self-pay | Admitting: Internal Medicine

## 2018-04-20 VITALS — BP 118/84 | HR 72 | Temp 98.0°F | Ht 62.0 in | Wt 127.9 lb

## 2018-04-20 DIAGNOSIS — R110 Nausea: Secondary | ICD-10-CM

## 2018-04-20 DIAGNOSIS — K588 Other irritable bowel syndrome: Secondary | ICD-10-CM

## 2018-04-20 MED ORDER — ONDANSETRON HCL 4 MG PO TABS: 4.0000 mg | ORAL_TABLET | Freq: Three times a day (TID) | ORAL | 1 refills | Status: DC | PRN

## 2018-04-20 MED ORDER — DICYCLOMINE HCL 10 MG PO CAPS: 10.0000 mg | ORAL_CAPSULE | Freq: Two times a day (BID) | ORAL | 3 refills | Status: AC

## 2018-04-20 NOTE — Patient Instructions (Signed)
Rx for Zofran and Dicyclomine sent to her pharmacy.

## 2018-04-20 NOTE — Progress Notes (Signed)
   Subjective:    Patient ID: Tina Yoder, female    DOB: May 08, 1966, 52 y.o.   MRN: [id number]  HPI Presents today with c/o diarrhea. For the past 2 weeks she has had diarrhea. For the last few days, she says the diarrhea has eased up. Has not had a BM today. Did have a formed stool yesterday.  Does have a hx of IBS and takes Dicyclomine BID as needed.   She tells today she feel okay. No nausea or diarrhea.  Sometimes she has muscle spasms in her abdomen. No pain.  Her last colonoscopy in 05/2016 which was normal. She is requesting a refill on her Zofran.  Her appetite is good. No weight loss.     Review of Systems Past Medical History:  Diagnosis Date  . History of seasonal allergies   . IBS (irritable bowel syndrome)     Past Surgical History:  Procedure Laterality Date  . Calcium deposit from left knee in 1981    . COLONOSCOPY N/A 05/15/2016   Procedure: COLONOSCOPY;  Surgeon: Rogene Houston, MD;  Location: [facility];  Service: Endoscopy;  Laterality: N/A;  2:00  . KNEE SURGERY Left     No Known Allergies  Current Outpatient Medications on File Prior to Visit  Medication Sig Dispense Refill  . cetirizine (ZYRTEC) 10 MG tablet Take 10 mg by mouth every morning.    . diphenhydrAMINE (BENADRYL) 25 MG tablet Take 25 mg by mouth every 8 (eight) hours as needed (allergies/irritable bowel syndrome).     . Gluc-Chonn-MSM-Boswellia-Vit D (GLUCOSAMINE CHOND TRIPLE/VIT D PO) Take by mouth.    Marland Kitchen ibuprofen (ADVIL,MOTRIN) 200 MG tablet Take 600 mg by mouth every 8 (eight) hours as needed (for pain).    . Melatonin 10 MG CAPS Take 5 mg by mouth.    . Multiple Vitamin (MULTIVITAMIN) tablet Take 1 tablet by mouth daily.     . Naphazoline-Pheniramine (OPCON-A) 0.027-0.315 % SOLN Place 1 drop into both eyes 3 (three) times daily as needed (for dry/itchy eyes).    . ondansetron (ZOFRAN ODT) 8 MG disintegrating tablet Take 1 tablet (8 mg total) by mouth every 8 (eight) hours as needed  for nausea or vomiting. 30 tablet 0  . simethicone (MYLICON) 038 MG chewable tablet Chew 125 mg by mouth every 6 (six) hours as needed for flatulence.    . JOLIVETTE 0.35 MG tablet Take 1 tablet by mouth at bedtime.   3  . ranitidine (ZANTAC) 150 MG tablet Take 150 mg by mouth 2 (two) times daily as needed (for acid reflux).      No current facility-administered medications on file prior to visit.         Objective:   Physical Exam Blood pressure 118/84, pulse 72, temperature 98 F (36.7 C), height 5\' 2"  (1.575 m), weight 127 lb 14.4 oz (58 kg). Alert and oriented. Skin warm and dry. Oral mucosa is moist.   . Sclera anicteric, conjunctivae is pink. Thyroid not enlarged. No cervical lymphadenopathy. Lungs clear. Heart regular rate and rhythm.  Abdomen is soft. Bowel sounds are positive. No hepatomegaly. No abdominal masses felt. No tenderness.  No edema to lower  extremities.          Assessment & Plan:  IBS. Continue the Dicyclomine as needed. Rx for Zofran for nausea sent to his pharmacy.

## 2018-06-29 DIAGNOSIS — R05 Cough: Secondary | ICD-10-CM | POA: Diagnosis not present

## 2018-06-29 DIAGNOSIS — Z6823 Body mass index (BMI) 23.0-23.9, adult: Secondary | ICD-10-CM | POA: Diagnosis not present

## 2018-06-29 DIAGNOSIS — J069 Acute upper respiratory infection, unspecified: Secondary | ICD-10-CM | POA: Diagnosis not present

## 2018-11-23 ENCOUNTER — Ambulatory Visit (INDEPENDENT_AMBULATORY_CARE_PROVIDER_SITE_OTHER): Payer: 59 | Admitting: Internal Medicine

## 2018-11-23 ENCOUNTER — Encounter (INDEPENDENT_AMBULATORY_CARE_PROVIDER_SITE_OTHER): Payer: Self-pay | Admitting: Internal Medicine

## 2018-11-23 ENCOUNTER — Other Ambulatory Visit: Payer: Self-pay

## 2018-11-23 VITALS — BP 108/60 | HR 78 | Temp 98.0°F | Ht 61.0 in | Wt 131.7 lb

## 2018-11-23 DIAGNOSIS — K625 Hemorrhage of anus and rectum: Secondary | ICD-10-CM | POA: Diagnosis not present

## 2018-11-23 NOTE — Patient Instructions (Signed)
The risks of bleeding, perforation and infection were reviewed with patient.  

## 2018-11-23 NOTE — Progress Notes (Addendum)
   Subjective:    Patient ID: Tina Yoder, female    DOB: 11/29/1965, 53 y.o.   MRN: [id number]  HPI Presents today with c/o rectal bleeding. She says this is out of the blood. She is not straining to have a BM. Occurred last Thursday x 1 and occurred yesterday. She noticed a clot with the blood yesterday.  No blood today. Has not had a BM today.  No rectal pain.  She also had an intense pain in her epigastric region and took Simethicone yesterday which relieved the pain. No change in her stools.  She feels like she may be constipated.  Her appetite is good. No weight loss.  Hx of UC in her family. No family hx of colon cancer. Had been taking Motrin 200mg  BID for ankle pain,  but stopped a couple of weeks ago.    Her last colonoscopy was in 2017 (screening) was normal. (Dr. Laural Golden).     Review of Systems Past Medical History:  Diagnosis Date  . History of seasonal allergies   . IBS (irritable bowel syndrome)     Past Surgical History:  Procedure Laterality Date  . Calcium deposit from left knee in 1981    . COLONOSCOPY N/A 05/15/2016   Procedure: COLONOSCOPY;  Surgeon: Rogene Houston, MD;  Location: [facility];  Service: Endoscopy;  Laterality: N/A;  2:00  . KNEE SURGERY Left     No Known Allergies  Current Outpatient Medications on File Prior to Visit  Medication Sig Dispense Refill  . cetirizine (ZYRTEC) 10 MG tablet Take 10 mg by mouth every morning.    . dicyclomine (BENTYL) 10 MG capsule Take 1 capsule (10 mg total) by mouth 2 (two) times daily before a meal. (Patient taking differently: Take 10 mg by mouth as needed. ) 90 capsule 3  . diphenhydrAMINE (BENADRYL) 25 MG tablet Take 25 mg by mouth every 8 (eight) hours as needed (allergies/irritable bowel syndrome).     Marland Kitchen ibuprofen (ADVIL,MOTRIN) 200 MG tablet Take 600 mg by mouth every 8 (eight) hours as needed (for pain).    . JOLIVETTE 0.35 MG tablet Take 1 tablet by mouth at bedtime.   3  . Melatonin 10 MG CAPS  Take 5 mg by mouth as needed.     . Multiple Vitamin (MULTIVITAMIN) tablet Take 1 tablet by mouth daily.     . Naphazoline-Pheniramine (OPCON-A) 0.027-0.315 % SOLN Place 1 drop into both eyes 3 (three) times daily as needed (for dry/itchy eyes).    . ondansetron (ZOFRAN ODT) 8 MG disintegrating tablet Take 1 tablet (8 mg total) by mouth every 8 (eight) hours as needed for nausea or vomiting. 30 tablet 0   No current facility-administered medications on file prior to visit.         Objective:   Physical Exam Blood pressure 108/60, pulse 78, temperature 98 F (36.7 C), height 5\' 1"  (1.549 m), weight 131 lb 11.2 oz (59.7 kg). Alert and oriented. Skin warm and dry. Oral mucosa is moist.   . Sclera anicteric, conjunctivae is pink. Thyroid not enlarged. No cervical lymphadenopathy. Lungs clear. Heart regular rate and rhythm.  Abdomen is soft. Bowel sounds are positive. No hepatomegaly. No abdominal masses felt. No tenderness.  No edema to lower extremities. Rectal: No masses. Hard stool noted. Guaiac negative.         Assessment & Plan:  Rectal bleeding. Colonic neoplasm needs to ruled out. Hemorrhoids also needs to be ruled.

## 2018-12-09 ENCOUNTER — Telehealth (INDEPENDENT_AMBULATORY_CARE_PROVIDER_SITE_OTHER): Payer: Self-pay | Admitting: *Deleted

## 2018-12-09 NOTE — Telephone Encounter (Signed)
Left patient several message to call and schedule TCS from her OV on 11/23/18 - no response as yet

## 2018-12-16 ENCOUNTER — Encounter (INDEPENDENT_AMBULATORY_CARE_PROVIDER_SITE_OTHER): Payer: Self-pay | Admitting: *Deleted

## 2018-12-16 ENCOUNTER — Telehealth (INDEPENDENT_AMBULATORY_CARE_PROVIDER_SITE_OTHER): Payer: Self-pay | Admitting: *Deleted

## 2018-12-16 ENCOUNTER — Other Ambulatory Visit (INDEPENDENT_AMBULATORY_CARE_PROVIDER_SITE_OTHER): Payer: Self-pay | Admitting: Internal Medicine

## 2018-12-16 DIAGNOSIS — K625 Hemorrhage of anus and rectum: Secondary | ICD-10-CM

## 2018-12-16 MED ORDER — PLENVU 140 G PO SOLR: 1.0000 | Freq: Once | ORAL | 0 refills | Status: AC

## 2018-12-16 NOTE — Telephone Encounter (Signed)
Patient needs plenvu 

## 2018-12-23 ENCOUNTER — Telehealth (INDEPENDENT_AMBULATORY_CARE_PROVIDER_SITE_OTHER): Payer: Self-pay | Admitting: *Deleted

## 2018-12-23 NOTE — Telephone Encounter (Signed)
noted 

## 2018-12-23 NOTE — Telephone Encounter (Signed)
Patient has called in and canceled her TCS for 01/24/19 due to (1) she isn;t bleeding anymore & (2) since she didn't go through her PCP prior to seeing [place], her insurance will not do a PA for the TCS

## 2019-01-03 ENCOUNTER — Other Ambulatory Visit: Payer: 59

## 2019-01-03 ENCOUNTER — Other Ambulatory Visit: Payer: Self-pay

## 2019-01-03 DIAGNOSIS — Z20822 Contact with and (suspected) exposure to covid-19: Secondary | ICD-10-CM

## 2019-01-04 LAB — NOVEL CORONAVIRUS, NAA: SARS-CoV-2, NAA: DETECTED — AB

## 2019-01-05 ENCOUNTER — Telehealth: Payer: Self-pay | Admitting: *Deleted

## 2019-01-05 NOTE — Telephone Encounter (Signed)
Call to Dr Edythe Lynn office- spoke with Corbin Ade- made aware that patient is aware of her COVID result- they have been contacted by the patient.

## 2019-01-05 NOTE — Telephone Encounter (Signed)
Pt called for results; pt notified that she did test positive for COVID; she says the only symptom that she has is  Post nasal drip, and she assumed it was from allergies; she has been taking OTC meds and nasal spray which have helped; pt instructions: . Instruct the patient to remain in self-quarantine until they meet the "Non-Test Criteria for Ending Self-Isolation". Non-Test Criteria for Ending Self-Isolation All persons with fever and respiratory symptoms should isolate themselves until ALL conditions listed below are met: - at least 10 days since symptoms onset - AND 3 consecutive days fever free without antipyretics (acetaminophen [Tylenol] or ibuprofen [Advil]) - AND improvement in respiratory symptoms . If the patient develops respiratory issues/distress, seek medical care in the Emergency Department, call 911, reports symptoms and report COVID-19 positive test. Patient Instructions . Instruct the patient to continue to utilize over-the-counter medications for fever (ibuprofen and/or Tylenol) and cough (cough medicine and/or sore throat lozenges). Wannetta Sender the patient to wear a mask around people and follow good infection prevention techniques. . Patient to should only leave home to seek medical care. . Instruct patient to send family for food, prescriptions or medicines; or use delivery service.  . If the patient must leave the home, they must wear a mask in public. . Instruct patient to limit contact with immediate family members or caregivers in the home, and use mask, social distancing, and handwashing to decrease risk to patients. o Please continue good preventive care measures, including frequent hand washing, avoid touching your face, cover coughs/sneezes with tissue or into elbow, stay out of crowds and keep a 6-foot distance from others.   Instruct patient and family to clean hard surfaces touched by patient frequently with household cleaning products; she verbalized understanding;  pt also advised to notify her PCP; she again verbalized understanding.

## 2019-01-18 ENCOUNTER — Other Ambulatory Visit: Payer: Self-pay

## 2019-01-18 DIAGNOSIS — Z20822 Contact with and (suspected) exposure to covid-19: Secondary | ICD-10-CM

## 2019-01-19 LAB — NOVEL CORONAVIRUS, NAA: SARS-CoV-2, NAA: NOT DETECTED

## 2019-01-21 ENCOUNTER — Other Ambulatory Visit (HOSPITAL_COMMUNITY): Payer: 59

## 2019-01-24 ENCOUNTER — Ambulatory Visit (HOSPITAL_COMMUNITY): Admit: 2019-01-24 | Payer: 59 | Admitting: Internal Medicine

## 2019-01-24 ENCOUNTER — Encounter (HOSPITAL_COMMUNITY): Payer: Self-pay

## 2019-01-24 SURGERY — COLONOSCOPY
Anesthesia: Moderate Sedation

## 2024-04-29 ENCOUNTER — Other Ambulatory Visit (HOSPITAL_COMMUNITY): Payer: Self-pay
